# Patient Record
Sex: Male | Born: 1955 | Race: White | Hispanic: No | Smoking: Current every day smoker
Health system: Southern US, Community
[De-identification: ages and names within clinical notes are randomized; demographics above are authoritative.]

## PROBLEM LIST (undated history)

## (undated) DIAGNOSIS — N200 Calculus of kidney: Secondary | ICD-10-CM

## (undated) DIAGNOSIS — E7800 Pure hypercholesterolemia, unspecified: Secondary | ICD-10-CM

## (undated) DIAGNOSIS — I10 Essential (primary) hypertension: Secondary | ICD-10-CM

## (undated) DIAGNOSIS — Z21 Asymptomatic human immunodeficiency virus [HIV] infection status: Secondary | ICD-10-CM

## (undated) DIAGNOSIS — I219 Acute myocardial infarction, unspecified: Secondary | ICD-10-CM

## (undated) DIAGNOSIS — B20 Human immunodeficiency virus [HIV] disease: Secondary | ICD-10-CM

## (undated) HISTORY — PX: CORONARY ANGIOPLASTY WITH STENT PLACEMENT: SHX49

## (undated) HISTORY — PX: CHOLECYSTECTOMY: SHX55

---

## 2004-08-25 ENCOUNTER — Ambulatory Visit: Payer: Self-pay | Admitting: Family Medicine

## 2008-01-21 ENCOUNTER — Emergency Department: Payer: Self-pay | Admitting: Emergency Medicine

## 2015-05-14 ENCOUNTER — Emergency Department
Admission: EM | Admit: 2015-05-14 | Discharge: 2015-05-14 | Disposition: A | Discharge status: Home / Self Care | Payer: Medicare Other | Attending: Emergency Medicine | Admitting: Emergency Medicine

## 2015-05-14 ENCOUNTER — Emergency Department: Payer: Medicare Other

## 2015-05-14 ENCOUNTER — Encounter: Payer: Self-pay | Admitting: *Deleted

## 2015-05-14 DIAGNOSIS — K5900 Constipation, unspecified: Secondary | ICD-10-CM | POA: Diagnosis not present

## 2015-05-14 DIAGNOSIS — R109 Unspecified abdominal pain: Secondary | ICD-10-CM | POA: Diagnosis present

## 2015-05-14 DIAGNOSIS — Z7982 Long term (current) use of aspirin: Secondary | ICD-10-CM | POA: Diagnosis not present

## 2015-05-14 DIAGNOSIS — Z79899 Other long term (current) drug therapy: Secondary | ICD-10-CM | POA: Diagnosis not present

## 2015-05-14 DIAGNOSIS — I10 Essential (primary) hypertension: Secondary | ICD-10-CM | POA: Diagnosis not present

## 2015-05-14 DIAGNOSIS — F17210 Nicotine dependence, cigarettes, uncomplicated: Secondary | ICD-10-CM | POA: Diagnosis not present

## 2015-05-14 DIAGNOSIS — I252 Old myocardial infarction: Secondary | ICD-10-CM | POA: Diagnosis not present

## 2015-05-14 HISTORY — DX: Acute myocardial infarction, unspecified: I21.9

## 2015-05-14 HISTORY — DX: Human immunodeficiency virus (HIV) disease: B20

## 2015-05-14 HISTORY — DX: Essential (primary) hypertension: I10

## 2015-05-14 HISTORY — DX: Calculus of kidney: N20.0

## 2015-05-14 HISTORY — DX: Pure hypercholesterolemia, unspecified: E78.00

## 2015-05-14 HISTORY — DX: Asymptomatic human immunodeficiency virus (hiv) infection status: Z21

## 2015-05-14 LAB — COMPREHENSIVE METABOLIC PANEL
ALBUMIN: 4.7 g/dL (ref 3.5–5.0)
ALT: 27 U/L (ref 17–63)
AST: 23 U/L (ref 15–41)
Alkaline Phosphatase: 85 U/L (ref 38–126)
Anion gap: 8 (ref 5–15)
BUN: 16 mg/dL (ref 6–20)
CHLORIDE: 103 mmol/L (ref 101–111)
CO2: 25 mmol/L (ref 22–32)
Calcium: 9.4 mg/dL (ref 8.9–10.3)
Creatinine, Ser: 0.82 mg/dL (ref 0.61–1.24)
GFR calc Af Amer: >60 mL/min (ref >60)
GFR calc non Af Amer: >60 mL/min (ref >60)
GLUCOSE: 85 mg/dL (ref 65–99)
POTASSIUM: 3.8 mmol/L (ref 3.5–5.1)
Sodium: 136 mmol/L (ref 135–145)
Total Bilirubin: 0.8 mg/dL (ref 0.3–1.2)
Total Protein: 7.9 g/dL (ref 6.5–8.1)

## 2015-05-14 LAB — CBC
HCT: 47.3 % (ref 40.0–52.0)
Hemoglobin: 16.3 g/dL (ref 13.0–18.0)
MCH: 33.2 pg (ref 26.0–34.0)
MCHC: 34.5 g/dL (ref 32.0–36.0)
MCV: 96.2 fL (ref 80.0–100.0)
Platelets: 239 10*3/uL (ref 150–440)
RBC: 4.91 MIL/uL (ref 4.40–5.90)
RDW: 13.7 % (ref 11.5–14.5)
WBC: 7.5 10*3/uL (ref 3.8–10.6)

## 2015-05-14 LAB — URINALYSIS COMPLETE WITH MICROSCOPIC (ARMC ONLY)
BACTERIA UA: NONE SEEN
Bilirubin Urine: NEGATIVE
GLUCOSE, UA: NEGATIVE mg/dL
HGB URINE DIPSTICK: NEGATIVE
Ketones, ur: NEGATIVE mg/dL
LEUKOCYTES UA: NEGATIVE
Nitrite: NEGATIVE
Protein, ur: NEGATIVE mg/dL
RBC / HPF: NONE SEEN RBC/hpf (ref 0–5)
SQUAMOUS EPITHELIAL / LPF: NONE SEEN
Specific Gravity, Urine: 1.013 (ref 1.005–1.030)
pH: 5 (ref 5.0–8.0)

## 2015-05-14 LAB — LIPASE, BLOOD: LIPASE: 24 U/L (ref 11–51)

## 2015-05-14 MED ORDER — POLYETHYLENE GLYCOL 3350 17 G PO PACK: 17.0000 g | PACK | Freq: Every day | ORAL | Status: AC

## 2015-05-14 NOTE — ED Notes (Signed)
Labs reviewed and WNL. Awaiting room for MD eval.

## 2015-05-14 NOTE — ED Notes (Signed)
Pt c/o RLQ abdominal pain starting x 3 days ago. Pt states worse pain after eating. Pt denies n/v/d at this time. Pt has had cholecystectomy. Pt c/o difficulty having BM's. Pt denies urinary sxs.

## 2015-05-14 NOTE — Discharge Instructions (Signed)
Constipation, Adult °Constipation is when a person has fewer than three bowel movements a week, has difficulty having a bowel movement, or has stools that are dry, hard, or larger than normal. As people grow older, constipation is more common. A low-fiber diet, not taking in enough fluids, and taking certain medicines may make constipation worse.  °CAUSES  °· Certain medicines, such as antidepressants, pain medicine, iron supplements, antacids, and water pills.   °· Certain diseases, such as diabetes, irritable bowel syndrome (IBS), thyroid disease, or depression.   °· Not drinking enough water.   °· Not eating enough fiber-rich foods.   °· Stress or travel.   °· Lack of physical activity or exercise.   °· Ignoring the urge to have a bowel movement.   °· Using laxatives too much.   °SIGNS AND SYMPTOMS  °· Having fewer than three bowel movements a week.   °· Straining to have a bowel movement.   °· Having stools that are hard, dry, or larger than normal.   °· Feeling full or bloated.   °· Pain in the lower abdomen.   °· Not feeling relief after having a bowel movement.   °DIAGNOSIS  °Your health care provider will take a medical history and perform a physical exam. Further testing may be done for severe constipation. Some tests may include: °· A barium enema X-ray to examine your rectum, colon, and, sometimes, your small intestine.   °· A sigmoidoscopy to examine your lower colon.   °· A colonoscopy to examine your entire colon. °TREATMENT  °Treatment will depend on the severity of your constipation and what is causing it. Some dietary treatments include drinking more fluids and eating more fiber-rich foods. Lifestyle treatments may include regular exercise. If these diet and lifestyle recommendations do not help, your health care provider may recommend taking over-the-counter laxative medicines to help you have bowel movements. Prescription medicines may be prescribed if over-the-counter medicines do not work.    °HOME CARE INSTRUCTIONS  °· Eat foods that have a lot of fiber, such as fruits, vegetables, whole grains, and beans. °· Limit foods high in fat and processed sugars, such as french fries, hamburgers, cookies, candies, and soda.   °· A fiber supplement may be added to your diet if you cannot get enough fiber from foods.   °· Drink enough fluids to keep your urine clear or pale yellow.   °· Exercise regularly or as directed by your health care provider.   °· Go to the restroom when you have the urge to go. Do not hold it.   °· Only take over-the-counter or prescription medicines as directed by your health care provider. Do not take other medicines for constipation without talking to your health care provider first.   °SEEK IMMEDIATE MEDICAL CARE IF:  °· You have bright red blood in your stool.   °· Your constipation lasts for more than 4 days or gets worse.   °· You have abdominal or rectal pain.   °· You have thin, pencil-like stools.   °· You have unexplained weight loss. °MAKE SURE YOU:  °· Understand these instructions. °· Will watch your condition. °· Will get help right away if you are not doing well or get worse. °  °This information is not intended to replace advice given to you by your health care provider. Make sure you discuss any questions you have with your health care provider. °  °Document Released: 10/21/2003 Document Revised: 02/12/2014 Document Reviewed: 11/03/2012 °Elsevier Interactive Patient Education ©2016 [employer] ° °High-Fiber Diet °Fiber, also called dietary fiber, is a type of carbohydrate found in fruits, vegetables, whole grains, and   beans. A high-fiber diet can have many health benefits. Your health care provider may recommend a high-fiber diet to help: °· Prevent constipation. Fiber can make your bowel movements more regular. °· Lower your cholesterol. °· Relieve hemorrhoids, uncomplicated diverticulosis, or irritable bowel syndrome. °· Prevent overeating as part of a weight-loss  plan. °· Prevent heart disease, type 2 diabetes, and certain cancers. °WHAT IS MY PLAN? °The recommended daily intake of fiber includes: °· 38 grams for men under age 50. °· 30 grams for men over age 50. °· 25 grams for women under age 50. °· 21 grams for women over age 50. °You can get the recommended daily intake of dietary fiber by eating a variety of fruits, vegetables, grains, and beans. Your health care provider may also recommend a fiber supplement if it is not possible to get enough fiber through your diet. °WHAT DO I NEED TO KNOW ABOUT A HIGH-FIBER DIET? °· Fiber supplements have not been widely studied for their effectiveness, so it is better to get fiber through food sources. °· Always check the fiber content on the nutrition facts label of any prepackaged food. Look for foods that contain at least 5 grams of fiber per serving. °· Ask your dietitian if you have questions about specific foods that are related to your condition, especially if those foods are not listed in the following section. °· Increase your daily fiber consumption gradually. Increasing your intake of dietary fiber too quickly may cause bloating, cramping, or gas. °· Drink plenty of water. Water helps you to digest fiber. °WHAT FOODS CAN I EAT? °Grains °Whole-grain breads. Multigrain cereal. Oats and oatmeal. Brown rice. Barley. Bulgur wheat. Millet. Bran muffins. Popcorn. Rye wafer crackers. °Vegetables °Sweet potatoes. Spinach. Kale. Artichokes. Cabbage. Broccoli. Green peas. Carrots. Squash. °Fruits °Berries. Pears. Apples. Oranges. Avocados. Prunes and raisins. Dried figs. °Meats and Other Protein Sources °[place], kidney, pinto, and soy beans. Split peas. Lentils. Nuts and seeds. °Dairy °Fiber-fortified yogurt. °Beverages °Fiber-fortified soy milk. Fiber-fortified orange juice. °Other °Fiber bars. °The items listed above may not be a complete list of recommended foods or beverages. Contact your dietitian for more options. °WHAT FOODS  ARE NOT RECOMMENDED? °Grains °White bread. Pasta made with refined flour. White rice. °Vegetables °Fried potatoes. Canned vegetables. Well-cooked vegetables.  °Fruits °Fruit juice. Cooked, strained fruit. °Meats and Other Protein Sources °Fatty cuts of meat. Fried poultry or fried fish. °Dairy °Milk. Yogurt. Cream cheese. Sour cream. °Beverages °Soft drinks. °Other °Cakes and pastries. Butter and oils. °The items listed above may not be a complete list of foods and beverages to avoid. Contact your dietitian for more information. °WHAT ARE SOME TIPS FOR INCLUDING HIGH-FIBER FOODS IN MY DIET? °· Eat a wide variety of high-fiber foods. °· Make sure that half of all grains consumed each day are whole grains. °· Replace breads and cereals made from refined flour or white flour with whole-grain breads and cereals. °· Replace white rice with brown rice, bulgur wheat, or millet. °· Start the day with a breakfast that is high in fiber, such as a cereal that contains at least 5 grams of fiber per serving. °· Use beans in place of meat in soups, salads, or pasta. °· Eat high-fiber snacks, such as berries, raw vegetables, nuts, or popcorn. °  °This information is not intended to replace advice given to you by your health care provider. Make sure you discuss any questions you have with your health care provider. °  °Document Released: 01/22/2005 Document Revised: 02/12/2014 Document   Reviewed: 07/07/2013 °Elsevier Interactive Patient Education ©2016 Elsevier Inc. ° °

## 2015-05-14 NOTE — ED Provider Notes (Signed)
[facility] Emergency Department Provider Note     Time seen: ----------------------------------------- 9:22 PM on 05/14/2015 -----------------------------------------    I have reviewed the triage vital signs and the nursing notes.   HISTORY  Chief Complaint Abdominal Pain    HPI Tyler Campbell is a 60 y.o. male who presents to ER for right-sided abdominal pain that started 3 days ago. Patient states she's not had a bowel movement in 4 days, pain is worst after eating. He denies fevers, chills, chest pain, shortness of breath, nausea vomiting or diarrhea. Patient denies any urinary symptoms.   Past Medical History  Diagnosis Date  . Kidney stones   . HIV (human immunodeficiency virus infection) ([place])   . Myocardial infarct ([place])   . Hypertension   . Hypercholesteremia     There are no active problems to display for this patient.   Past Surgical History  Procedure Laterality Date  . Cholecystectomy    . Coronary angioplasty with stent placement Right     Allergies Review of patient's allergies indicates no known allergies.  Social History Social History  Substance Use Topics  . Smoking status: Current Every Day Smoker    Types: Cigarettes  . Smokeless tobacco: Never Used  . Alcohol Use: Yes     Comment: rarely    Review of Systems Constitutional: Negative for fever. Eyes: Negative for visual changes. ENT: Negative for sore throat. Cardiovascular: Negative for chest pain. Respiratory: Negative for shortness of breath. Gastrointestinal: Positive for abdominal pain and constipation Genitourinary: Negative for dysuria. Musculoskeletal: Negative for back pain. Skin: Negative for rash. Neurological: Negative for headaches, focal weakness or numbness.  10-point ROS otherwise negative.  ____________________________________________   PHYSICAL EXAM:  VITAL SIGNS: ED Triage Vitals  Enc Vitals Group     BP 05/14/15 1913  143/73 mmHg     Pulse Rate 05/14/15 1913 97     Resp 05/14/15 1913 16     Temp 05/14/15 1913 97.8 F (36.6 C)     Temp Source 05/14/15 1913 Oral     SpO2 05/14/15 1913 94 %     Weight 05/14/15 1913 169 lb (76.658 kg)     Height 05/14/15 1913 5\' 8"  (1.727 m)     Head Cir --      Peak Flow --      Pain Score 05/14/15 1916 4     Pain Loc --      Pain Edu? --      Excl. in [place]? --     Constitutional: Alert and oriented. Well appearing and in no distress. Eyes: Conjunctivae are normal. PERRL. Normal extraocular movements. ENT   Head: Normocephalic and atraumatic.   Nose: No congestion/rhinnorhea.   Mouth/Throat: Mucous membranes are moist.   Neck: No stridor. Cardiovascular: Normal rate, regular rhythm. Normal and symmetric distal pulses are present in all extremities. No murmurs, rubs, or gallops. Respiratory: Normal respiratory effort without tachypnea nor retractions. Breath sounds are clear and equal bilaterally. No wheezes/rales/rhonchi. Gastrointestinal: Soft and nontender. No distention. No abdominal bruits. Normal bowel sounds Musculoskeletal: Nontender with normal range of motion in all extremities. No joint effusions.  No lower extremity tenderness nor edema. Neurologic:  Normal speech and language. No gross focal neurologic deficits are appreciated. Speech is normal. No gait instability. Skin:  Skin is warm, dry and intact. No rash noted. Psychiatric: Mood and affect are normal. Speech and behavior are normal. Patient exhibits appropriate insight and judgment. _ ____________________________________________  ED COURSE:  Pertinent  labs & imaging results that were available during my care of the patient were reviewed by me and considered in my medical decision making (see chart for details). Patient is in no acute distress, likely constipation. We will check basic labs and x-rays. ____________________________________________    LABS (pertinent  positives/negatives)  Labs Reviewed  URINALYSIS COMPLETEWITH MICROSCOPIC ([place]) - Abnormal; Notable for the following:    Color, Urine YELLOW (*)    APPearance CLEAR (*)    All other components within normal limits  LIPASE, BLOOD  COMPREHENSIVE METABOLIC PANEL  CBC    RADIOLOGY Images were viewed by me  KUB reveals constipation  ____________________________________________  FINAL ASSESSMENT AND PLAN  Constipation  Plan: Patient with labs and imaging as dictated above. Patient's workup here is been unremarkable, he'll be discharged with MiraLAX and encouraged to have close follow-up with his doctor for improving.   Earleen Newport, MD   Earleen Newport, MD 05/14/15 2123

## 2015-07-05 ENCOUNTER — Emergency Department
Admission: EM | Admit: 2015-07-05 | Discharge: 2015-07-05 | Disposition: A | Discharge status: Home / Self Care | Payer: Medicare Other | Attending: Emergency Medicine | Admitting: Emergency Medicine

## 2015-07-05 ENCOUNTER — Emergency Department: Payer: Medicare Other

## 2015-07-05 ENCOUNTER — Encounter: Payer: Self-pay | Admitting: Emergency Medicine

## 2015-07-05 DIAGNOSIS — Z955 Presence of coronary angioplasty implant and graft: Secondary | ICD-10-CM | POA: Diagnosis not present

## 2015-07-05 DIAGNOSIS — M7581 Other shoulder lesions, right shoulder: Secondary | ICD-10-CM

## 2015-07-05 DIAGNOSIS — M25511 Pain in right shoulder: Secondary | ICD-10-CM | POA: Diagnosis present

## 2015-07-05 DIAGNOSIS — F17210 Nicotine dependence, cigarettes, uncomplicated: Secondary | ICD-10-CM | POA: Insufficient documentation

## 2015-07-05 DIAGNOSIS — I252 Old myocardial infarction: Secondary | ICD-10-CM | POA: Diagnosis not present

## 2015-07-05 DIAGNOSIS — Z79899 Other long term (current) drug therapy: Secondary | ICD-10-CM | POA: Insufficient documentation

## 2015-07-05 DIAGNOSIS — I10 Essential (primary) hypertension: Secondary | ICD-10-CM | POA: Insufficient documentation

## 2015-07-05 DIAGNOSIS — M75101 Unspecified rotator cuff tear or rupture of right shoulder, not specified as traumatic: Secondary | ICD-10-CM | POA: Insufficient documentation

## 2015-07-05 DIAGNOSIS — Z7982 Long term (current) use of aspirin: Secondary | ICD-10-CM | POA: Diagnosis not present

## 2015-07-05 DIAGNOSIS — B20 Human immunodeficiency virus [HIV] disease: Secondary | ICD-10-CM | POA: Diagnosis not present

## 2015-07-05 DIAGNOSIS — M7531 Calcific tendinitis of right shoulder: Secondary | ICD-10-CM | POA: Insufficient documentation

## 2015-07-05 MED ORDER — KETOROLAC TROMETHAMINE 30 MG/ML IJ SOLN
INTRAMUSCULAR | Status: AC
  Filled 2015-07-05: qty 1

## 2015-07-05 MED ORDER — CYCLOBENZAPRINE HCL 5 MG PO TABS: 5.0000 mg | ORAL_TABLET | Freq: Three times a day (TID) | ORAL | Status: AC | PRN

## 2015-07-05 MED ORDER — KETOROLAC TROMETHAMINE 10 MG PO TABS: 10.0000 mg | ORAL_TABLET | Freq: Three times a day (TID) | ORAL | Status: AC

## 2015-07-05 MED ORDER — KETOROLAC TROMETHAMINE 60 MG/2ML IM SOLN
30.0000 mg | Freq: Once | INTRAMUSCULAR | Status: AC
  Administered 2015-07-05: 30 mg via INTRAMUSCULAR

## 2015-07-05 NOTE — ED Notes (Signed)
See triage note  States he was putting up siding and used his right arm a lot  haivng pain today with movement  Denies any fall no deformity noted

## 2015-07-05 NOTE — Discharge Instructions (Signed)
Calcific Tendinitis Calcific tendinitis occurs when crystals of calcium are deposited in a tendon. Tendons are bands of strong, fibrous tissue that attach muscles to bones. Tendons are an important part of joints. They make the joint move and they absorb some of the stress that a joint receives during use. When calcium is deposited in the tendon, the tendon becomes stiff, painful, and it can become swollen. Calcific tendinitis occurs frequently in the shoulder joint, in a structure called the rotator cuff. CAUSES  The cause of calcific tendinitis is unclear. It may be associated with:  Overuse of the tendon, such as from repetitive motion.  Excess stress on the tendon.  Aging.  Repetitive, mild injuries. SYMPTOMS   Pain may or may not be present. If it is present, it may occur when moving the joint.  Tenderness when pressure is applied to the tendon.  A snapping or popping sound when the joint moves.  Decreased motion of the joint.  Difficulty sleeping due to pain in the joint. DIAGNOSIS  Your health care provider will perform a physical exam. Imaging tests may also be used to make the diagnosis. These may include X-rays, an MRI, or a CT scan. TREATMENT  Generally, calcific tendinitis resolves on its own. Treatment for pain of calcific tendinitis may include:  Taking over-the-counter medicines, such as anti-inflammatory drugs.  Applying ice packs to the joint.  Following a specific exercise program to keep the joint working properly.  Attending physical therapy sessions.  Avoiding activities that cause pain. Treatment for more severe calcific tendinitis may require:  Injecting cortisone steroids or pain relieving medicines into or around the joint.  Manipulating the joint after you are given medicine to numb the area (local anesthetic).  Inflating the joint with sterile fluid to increase the flexibility of the tendons.  Shock wave therapy, which involves focusing sound  waves on the joint. If other treatments do not work, surgery may be done to clean out the calcium deposits and repair the tendons where needed. Most people do not need surgery. HOME CARE INSTRUCTIONS   Only take over-the-counter or prescription medicines for pain, fever, or discomfort as directed by your health care provider.  Follow your health care provider's recommendations for activity and exercise. SEEK MEDICAL CARE IF:  You notice an increase in pain or numbness.  You develop new weakness.  You notice increased joint stiffness or a sensation of looseness in the joint.  You notice increasing redness, swelling, or warmth around the joint area. SEEK IMMEDIATE MEDICAL CARE IF:  You have a fever or persistent symptoms for more than 2 to 3 days.  You have a fever and your symptoms suddenly get worse. MAKE SURE YOU:  Understand these instructions.  Will watch your condition.  Will get help right away if you are not doing well or get worse.   This information is not intended to replace advice given to you by your health care provider. Make sure you discuss any questions you have with your health care provider.   Document Released: 11/01/2007 Document Revised: 10/13/2014 Document Reviewed: 05/03/2011 Elsevier Interactive Patient Education 2016 [place] Cuff Tendinitis Rotator cuff tendinitis is inflammation of the tough, cord-like bands that connect muscle to bone (tendons) in your rotator cuff. Your rotator cuff is the collection of all the muscles and tendons that connect your arm to your shoulder. Your rotator cuff holds the head of your upper arm bone (humerus) in the cup (fossa) of your shoulder blade (scapula).  CAUSES Rotator cuff tendinitis is usually caused by overusing the joint involved.  SIGNS AND SYMPTOMS  Deep ache in the shoulder also felt on the outside upper arm over the shoulder muscle.  Point tenderness over the area that is injured.  Pain  comes on gradually and becomes worse with lifting the arm to the side (abduction) or turning it inward (internal rotation).  May lead to a chronic tear: When a rotator cuff tendon becomes inflamed, it runs the risk of losing its blood supply, causing some tendon fibers to die. This increases the risk that the tendon can fray and partially or completely tear. DIAGNOSIS Rotator cuff tendinitis is diagnosed by taking a medical history, performing a physical exam, and reviewing results of imaging exams. The medical history is useful to help determine the type of rotator cuff injury. The physical exam will include looking at the injured shoulder, feeling the injured area, and watching you do range-of-motion exercises. X-ray exams are typically done to rule out other causes of shoulder pain, such as fractures. MRI is the imaging exam usually used for significant shoulder injuries. Sometimes a dye study called CT arthrogram is done, but it is not as widely used as MRI. In some institutions, special ultrasound tests may also be used to aid in the diagnosis. TREATMENT  Less Severe Cases  Use of a sling to rest the shoulder for a short period of time. Prolonged use of the sling can cause stiffness, weakness, and loss of motion of the shoulder joint.  Anti-inflammatory medicines, such as ibuprofen or naproxen sodium, may be prescribed. More Severe Cases  Physical therapy.  Use of steroid injections into the shoulder joint.  Surgery. HOME CARE INSTRUCTIONS   Use a sling or splint until the pain decreases. Prolonged use of the sling can cause stiffness, weakness, and loss of motion of the shoulder joint.  Apply ice to the injured area:  Put ice in a plastic bag.  Place a towel between your skin and the bag.  Leave the ice on for 20 minutes, 2-3 times a day.  Try to avoid use other than gentle range of motion while your shoulder is painful. Use the shoulder and exercise only as directed by your  health care provider. Stop exercises or range of motion if pain or discomfort increases, unless directed otherwise by your health care provider.  Only take over-the-counter or prescription medicines for pain, discomfort, or fever as directed by your health care provider.  If you were given a shoulder sling and straps (immobilizer), do not remove it except as directed, or until you see a health care provider for a follow-up exam. If you need to remove it, move your arm as little as possible or as directed.  You may want to sleep on several pillows at night to lessen swelling and pain. SEEK IMMEDIATE MEDICAL CARE IF:   Your shoulder pain increases or new pain develops in your arm, hand, or fingers and is not relieved with medicines.  You have new, unexplained symptoms, especially increased numbness in the hands or loss of strength.  You develop any worsening of the problems that brought you in for care.  Your arm, hand, or fingers are numb or tingling.  Your arm, hand, or fingers are swollen, painful, or turn white or blue. MAKE SURE YOU:  Understand these instructions.  Will watch your condition.  Will get help right away if you are not doing well or get worse.   This information is not  intended to replace advice given to you by your health care provider. Make sure you discuss any questions you have with your health care provider.   Document Released: 04/14/2003 Document Revised: 02/12/2014 Document Reviewed: 09/03/2012 Elsevier Interactive Patient Education 2016 [place] with Dr. Rudene Christians if symptoms don't improve in 1-2 weeks with treatment. Take the prescription meds as directed. Advance activity as pain allows.

## 2015-07-05 NOTE — ED Notes (Signed)
Pt states on Friday he was putting up siding on a house and his right shoulder began hurting, now pt unable to move his right arm at all due to the pain.

## 2015-07-05 NOTE — ED Provider Notes (Signed)
[facility] Emergency Department Provider Note ____________________________________________  Time seen: 1750  I have reviewed the triage vital signs and the nursing notes.  HISTORY  Chief Complaint  Shoulder Pain  HPI Tyler Campbell is a 60 y.o. male presents to the ED for evaluation of pain and disability to his right shoulder. He describes that he was working on his home Friday but none citing, and by Saturday morning he awoke with significant decrease in range of motion to his right shoulder secondary to the pain. He is noted pain from the anterior lateral aspect of the shoulder with some referral into the upper extremity. He denies any history of previous shoulder injury, fracture, or rotator cuff tendinitis. He's been dosing ibuprofen with limited benefit. Denies any other injury, accident, fall to the shoulder.The patient is right-hand dominant and rates his pain at a 10/10 in triage.  Past Medical History  Diagnosis Date  . Kidney stones   . HIV (human immunodeficiency virus infection) ([place])   . Myocardial infarct ([place])   . Hypertension   . Hypercholesteremia     There are no active problems to display for this patient.   Past Surgical History  Procedure Laterality Date  . Cholecystectomy    . Coronary angioplasty with stent placement Right     Current Outpatient Rx  Name  Route  Sig  Dispense  Refill  . aspirin 81 MG tablet   Oral   Take 81 mg by mouth daily.         . cyclobenzaprine (FLEXERIL) 5 MG tablet   Oral   Take 1 tablet (5 mg total) by mouth 3 (three) times daily as needed for muscle spasms.   15 tablet   0   . emtricitabine-rilpivir-tenofovir AF (ODEFSEY) 200-25-25 MG TABS tablet   Oral   Take 1 tablet by mouth daily with breakfast.         . ketorolac (TORADOL) 10 MG tablet   Oral   Take 1 tablet (10 mg total) by mouth every 8 (eight) hours.   15 tablet   0   . lisinopril (PRINIVIL,ZESTRIL) 5 MG tablet   Oral    Take 5 mg by mouth daily.         . metoprolol tartrate (LOPRESSOR) 25 MG tablet   Oral   Take 25 mg by mouth 2 (two) times daily.         . polyethylene glycol (MIRALAX / GLYCOLAX) packet   Oral   Take 17 g by mouth daily.   14 each   0   . rosuvastatin (CRESTOR) 10 MG tablet   Oral   Take 10 mg by mouth daily.          Allergies Review of patient's allergies indicates no known allergies.  No family history on file.  Social History Social History  Substance Use Topics  . Smoking status: Current Every Day Smoker -- 1.00 packs/day    Types: Cigarettes  . Smokeless tobacco: Never Used  . Alcohol Use: Yes     Comment: rarely    Review of Systems  Constitutional: Negative for fever. Cardiovascular: Negative for chest pain. Respiratory: Negative for shortness of breath. Musculoskeletal: Negative for back pain. Right shoulder pain as above. Skin: Negative for rash. Neurological: Negative for headaches, focal weakness or numbness. ____________________________________________  PHYSICAL EXAM:  VITAL SIGNS: ED Triage Vitals  Enc Vitals Group     BP 07/05/15 1722 150/72 mmHg     Pulse Rate  07/05/15 1722 87     Resp 07/05/15 1722 18     Temp 07/05/15 1722 97.8 F (36.6 C)     Temp Source 07/05/15 1722 Oral     SpO2 07/05/15 1722 100 %     Weight 07/05/15 1722 170 lb (77.111 kg)     Height 07/05/15 1722 5\' 8"  (1.727 m)     Head Cir --      Peak Flow --      Pain Score 07/05/15 1722 10     Pain Loc --      Pain Edu? --      Excl. in [place]? --    Constitutional: Alert and oriented. Well appearing and in no distress. Head: Normocephalic and atraumatic. Cardiovascular: Normal rate, regular rhythm.  Respiratory: Normal respiratory effort. No wheezes/rales/rhonchi. Musculoskeletal: Right shoulder without obvious deformity, dislocation, or sulcus sign. Patient is tender to palpation over the lateral aspect of the shoulder at the before meals joint. He also some mild  biceps tendinitis on palp. The shin with severely decreased active range of motion secondary to pain. Passively he is able to extend the shoulder to about 90. Passively his abduction ranges about 60. He can't imaging a normal composite fist and his elbow and wrist exams are unremarkable. Nontender with normal range of motion in all other extremities.  Neurologic: Gross sensation to the right upper extremity. Normal gait without ataxia. Normal speech and language. No gross focal neurologic deficits are appreciated. Skin:  Skin is warm, dry and intact. No rash noted. ____________________________________________   RADIOLOGY  Right Shoulder   FINDINGS: Calcification along the rotator cuff. No fracture or subluxation. No degenerative changes at the glenohumeral joint. Negative visualized right chest.  IMPRESSION: Calcific tendinitis of the rotator cuff.  I, Jianni Shelden, Dannielle Karvonen, personally viewed and evaluated these images (plain radiographs) as part of my medical decision making, as well as reviewing the written report by the radiologist. ____________________________________________  PROCEDURES  Toradol 30 mg IM Arm sling ____________________________________________  INITIAL IMPRESSION / ASSESSMENT AND PLAN / ED COURSE  Patient with the neck to calcific tendinitis of the right shoulder and an acute rotator cuff tendinitis. He will be discharged with prescription for ketorolac and cyclobenzaprine. He'll be referred to or at the [facility] ongoing symptom management as needed. He is also provided with a sling for comfort to the right shoulder. His activities of limited by his pain and or disability. ____________________________________________  FINAL CLINICAL IMPRESSION(S) / ED DIAGNOSES  Final diagnoses:  Calcific tendinitis of shoulder, right  Rotator cuff tendinitis, right     Melvenia Needles, PA-C 07/06/15 0127  Lisa Roca, MD 07/08/15 [phone]

## 2017-08-07 IMAGING — CR DG ABDOMEN 1V
2 series · 2 of 2 positions shown · non-contrast
Comparison: None.

CLINICAL DATA: Right-sided abdominal swelling.  Constipation.

EXAM:
ABDOMEN - 1 VIEW

[abdomen kub (1 of 2)]
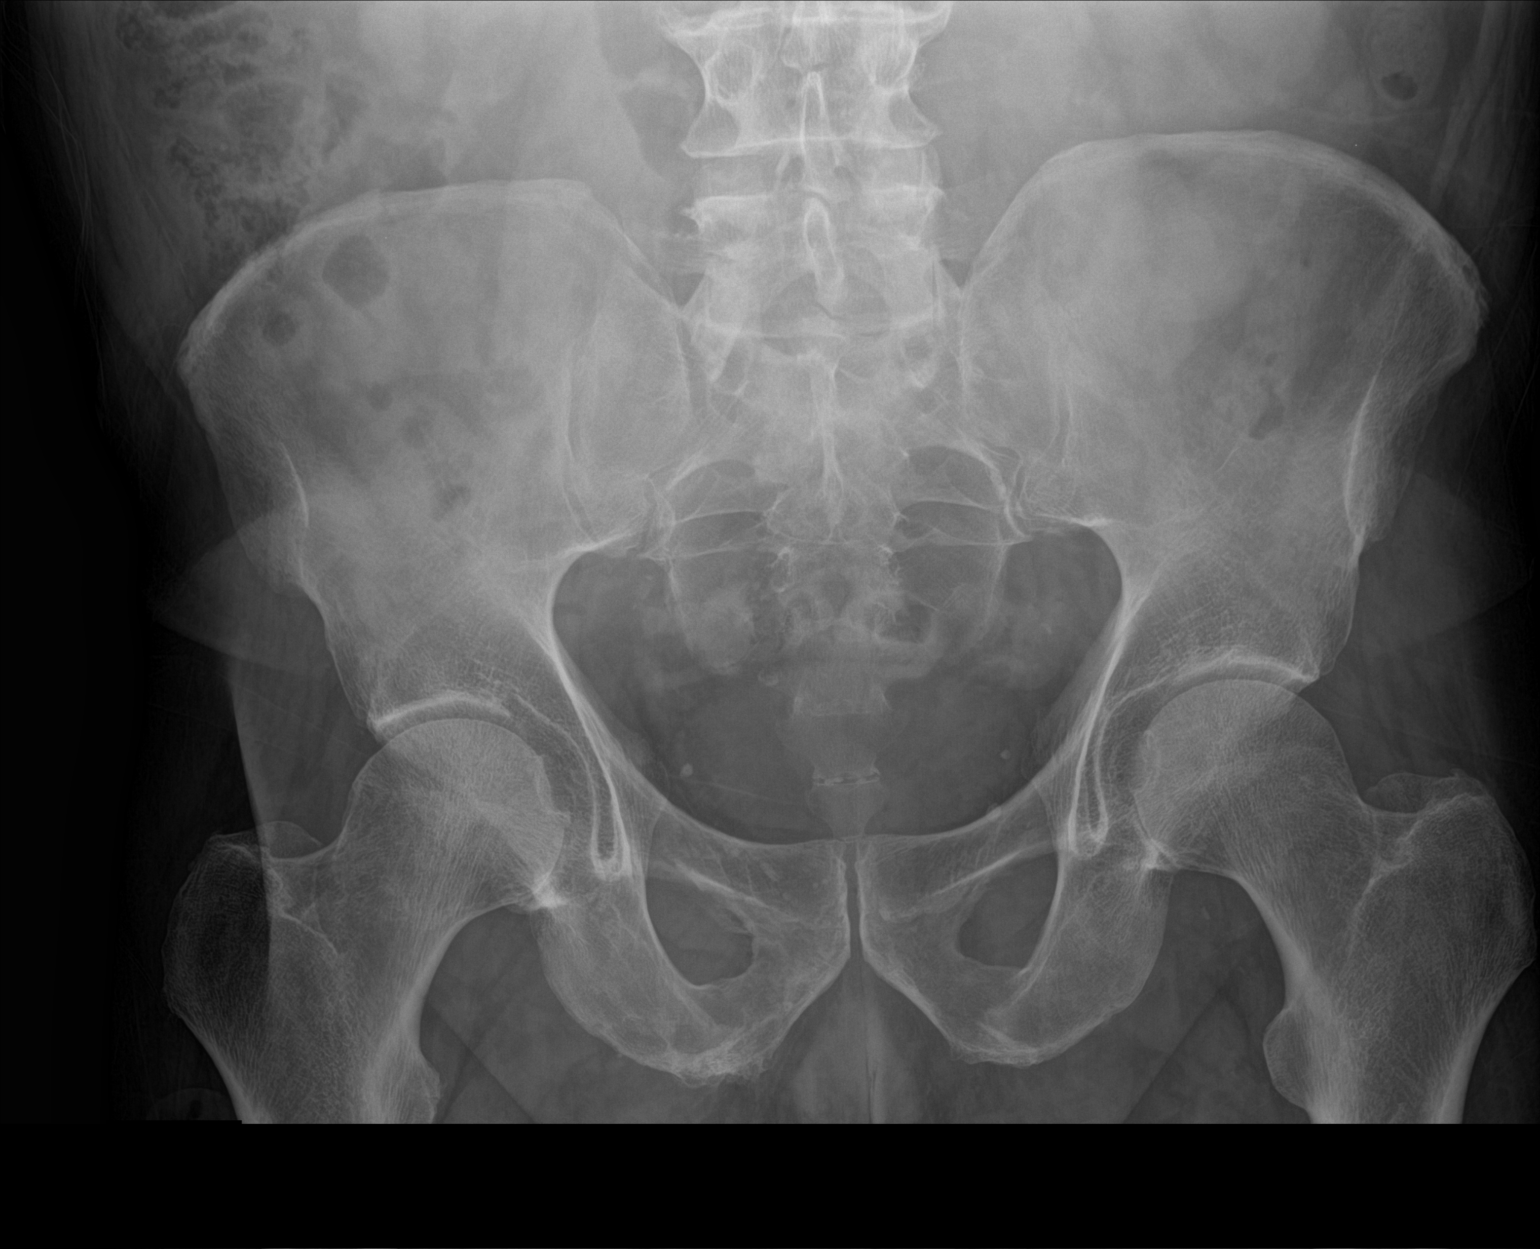

[abdomen kub (2 of 2)]
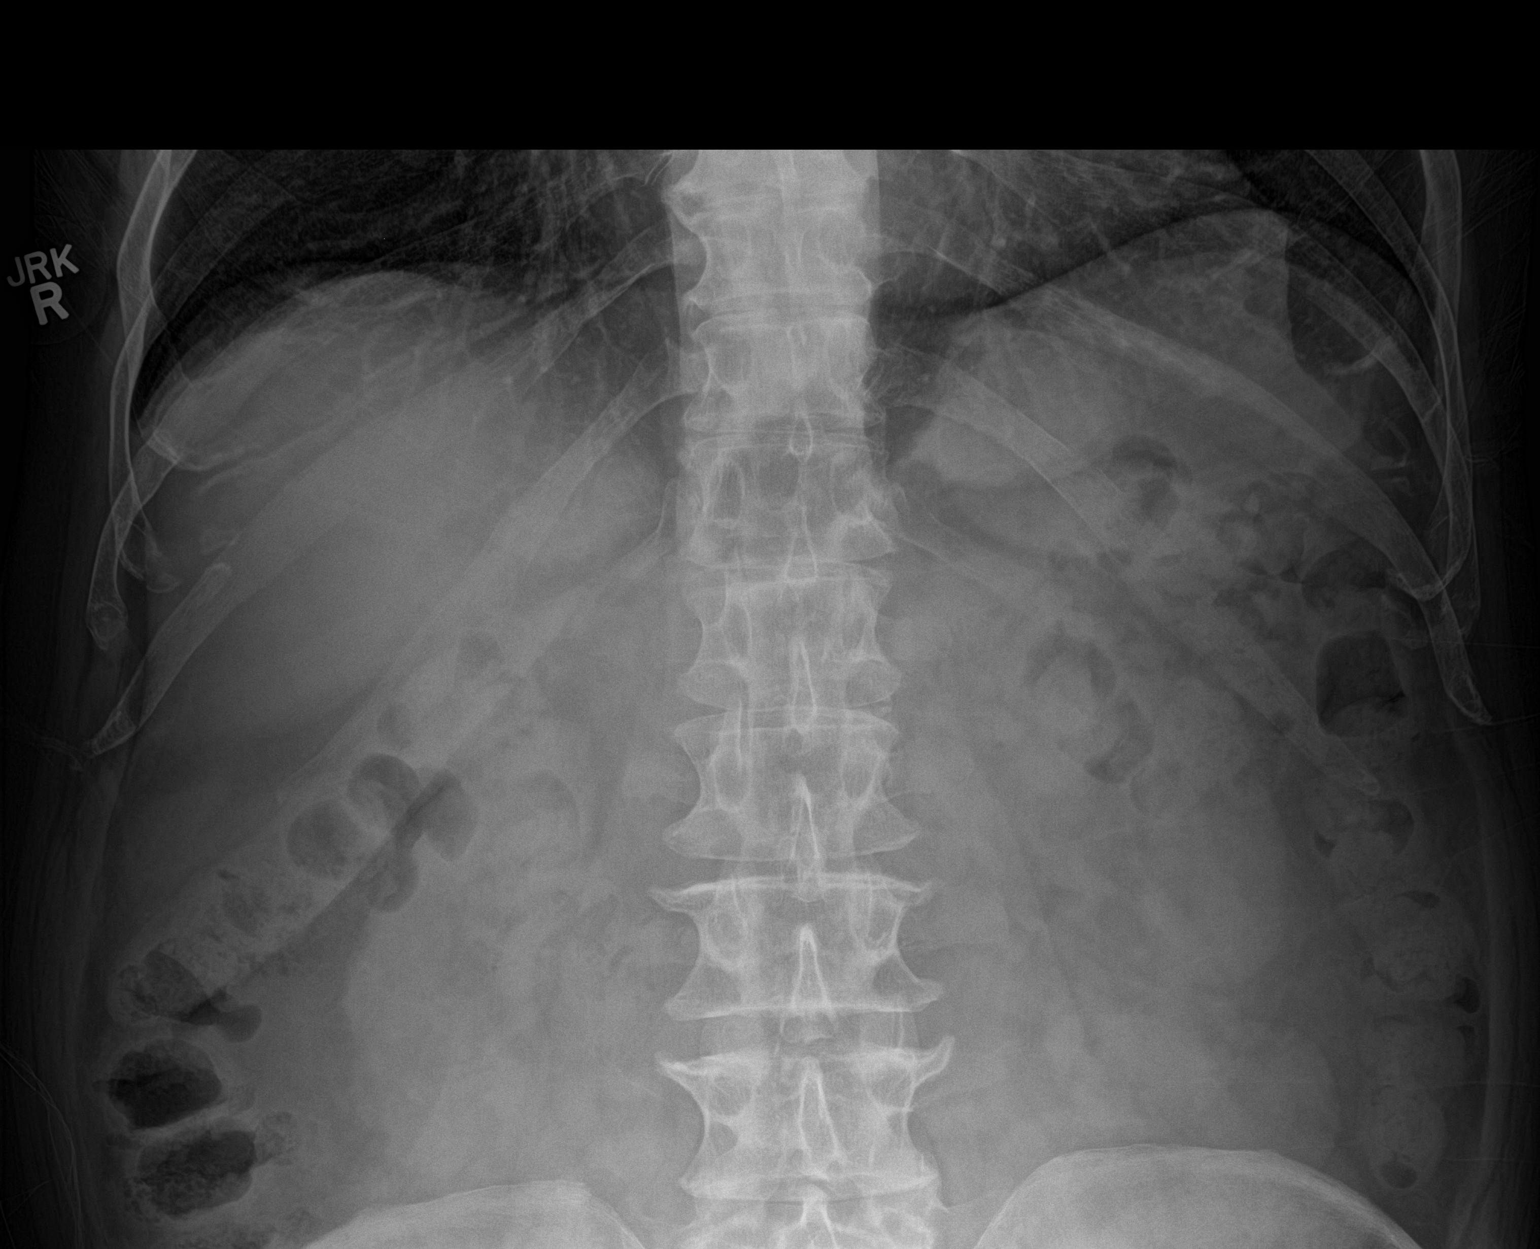

[2 of 2 positions shown; findings below may reference images not displayed]

FINDINGS: The bowel gas pattern is normal. No radio-opaque calculi or other
significant radiographic abnormality are seen.
IMPRESSION: Negative.

## 2017-09-28 IMAGING — CR DG SHOULDER 2+V*R*
3 series · 3 of 3 positions shown · non-contrast
Comparison: None.

CLINICAL DATA: Right shoulder pain beginning [REDACTED] with limited
range of motion.

EXAM:
RIGHT SHOULDER - 2+ VIEW

[shoulder grashey (1 of 2)]
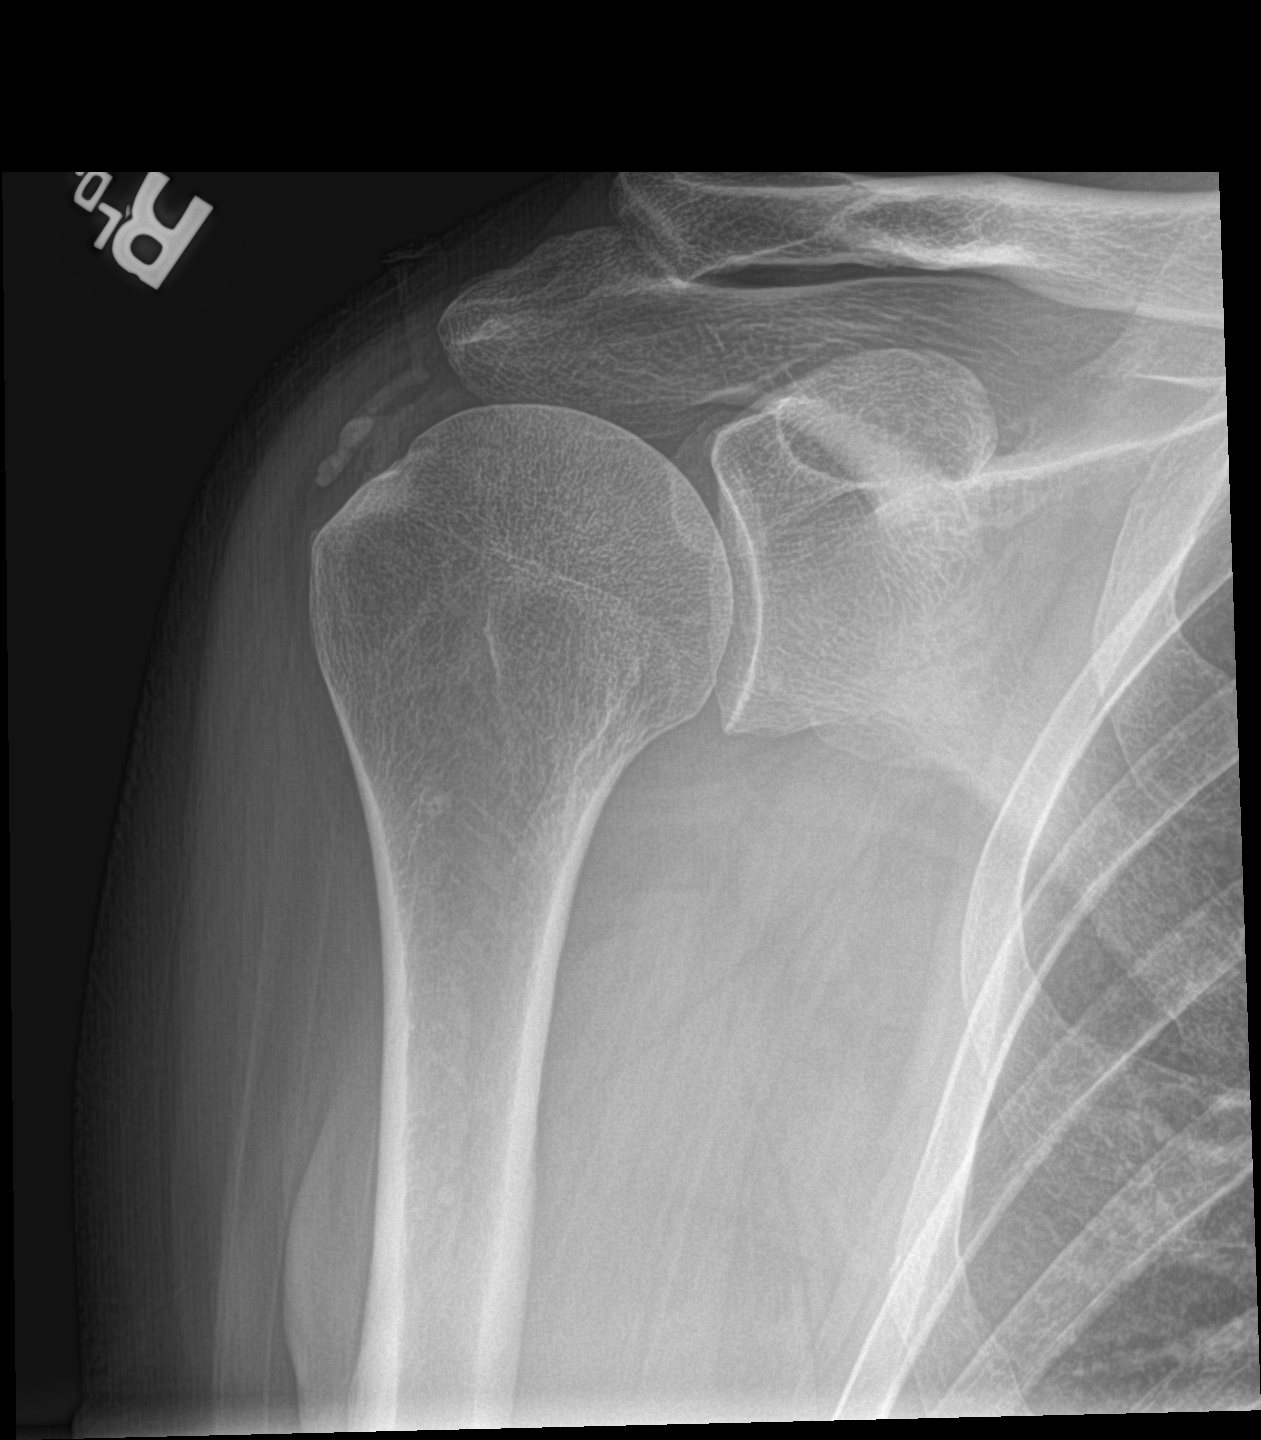

[shoulder y view]
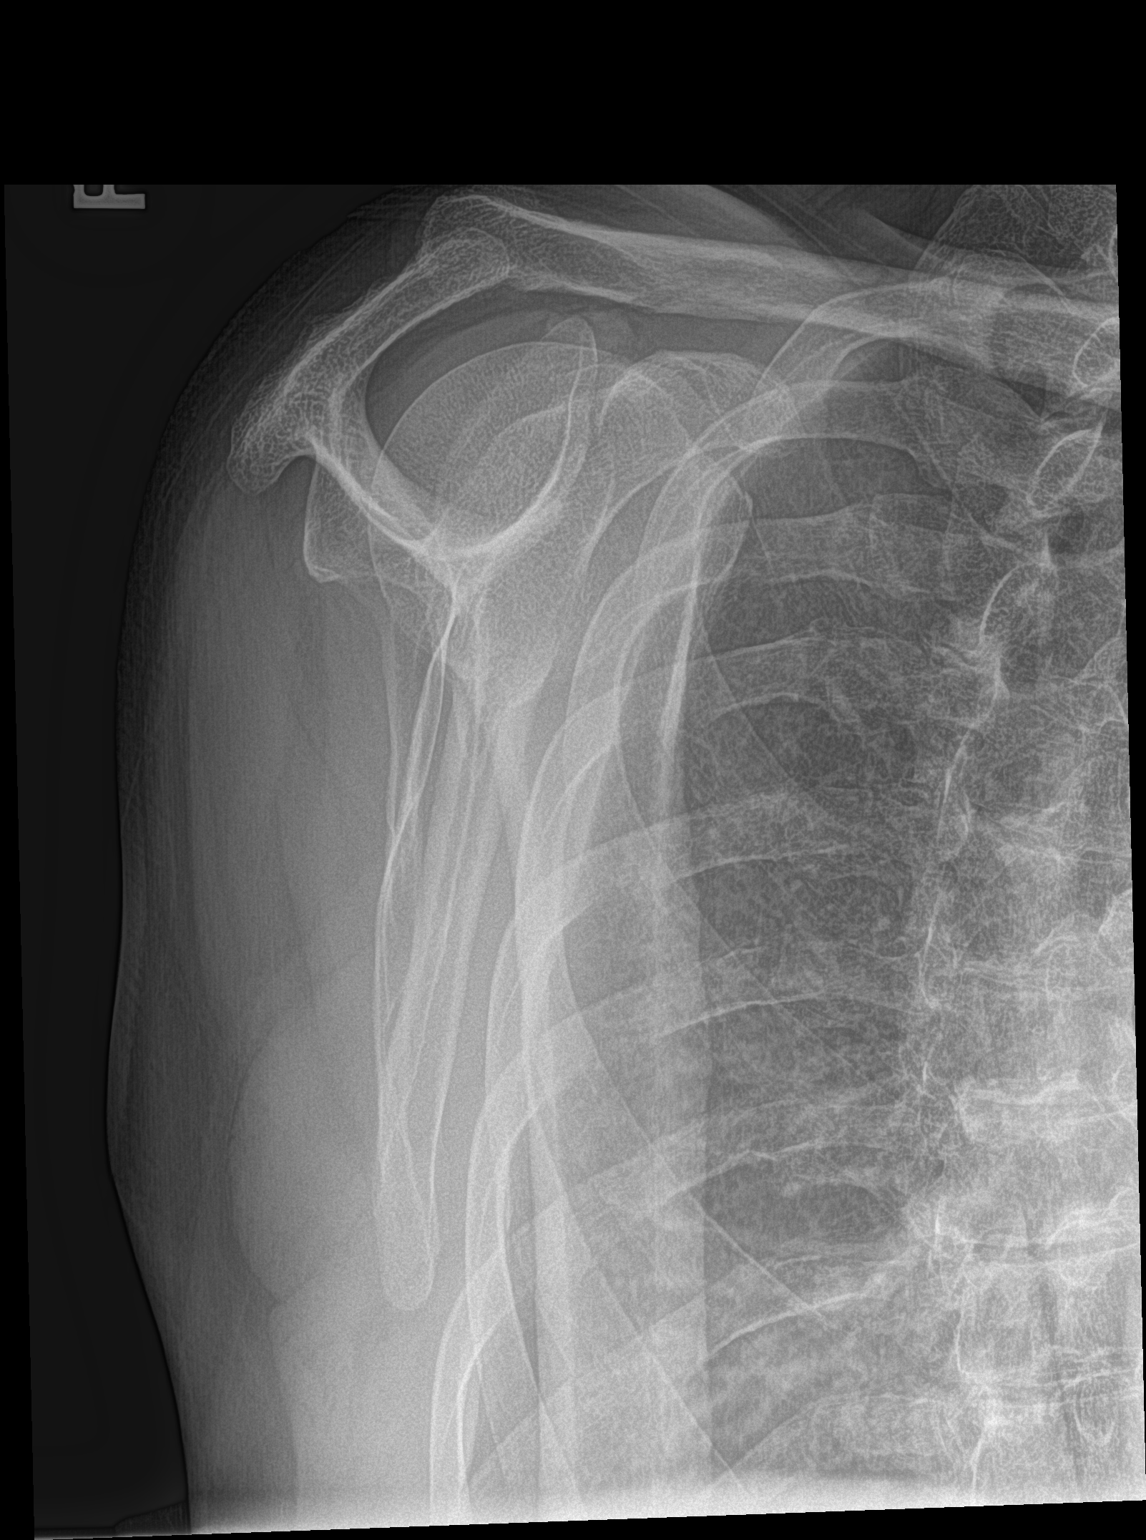

[shoulder grashey (2 of 2)]
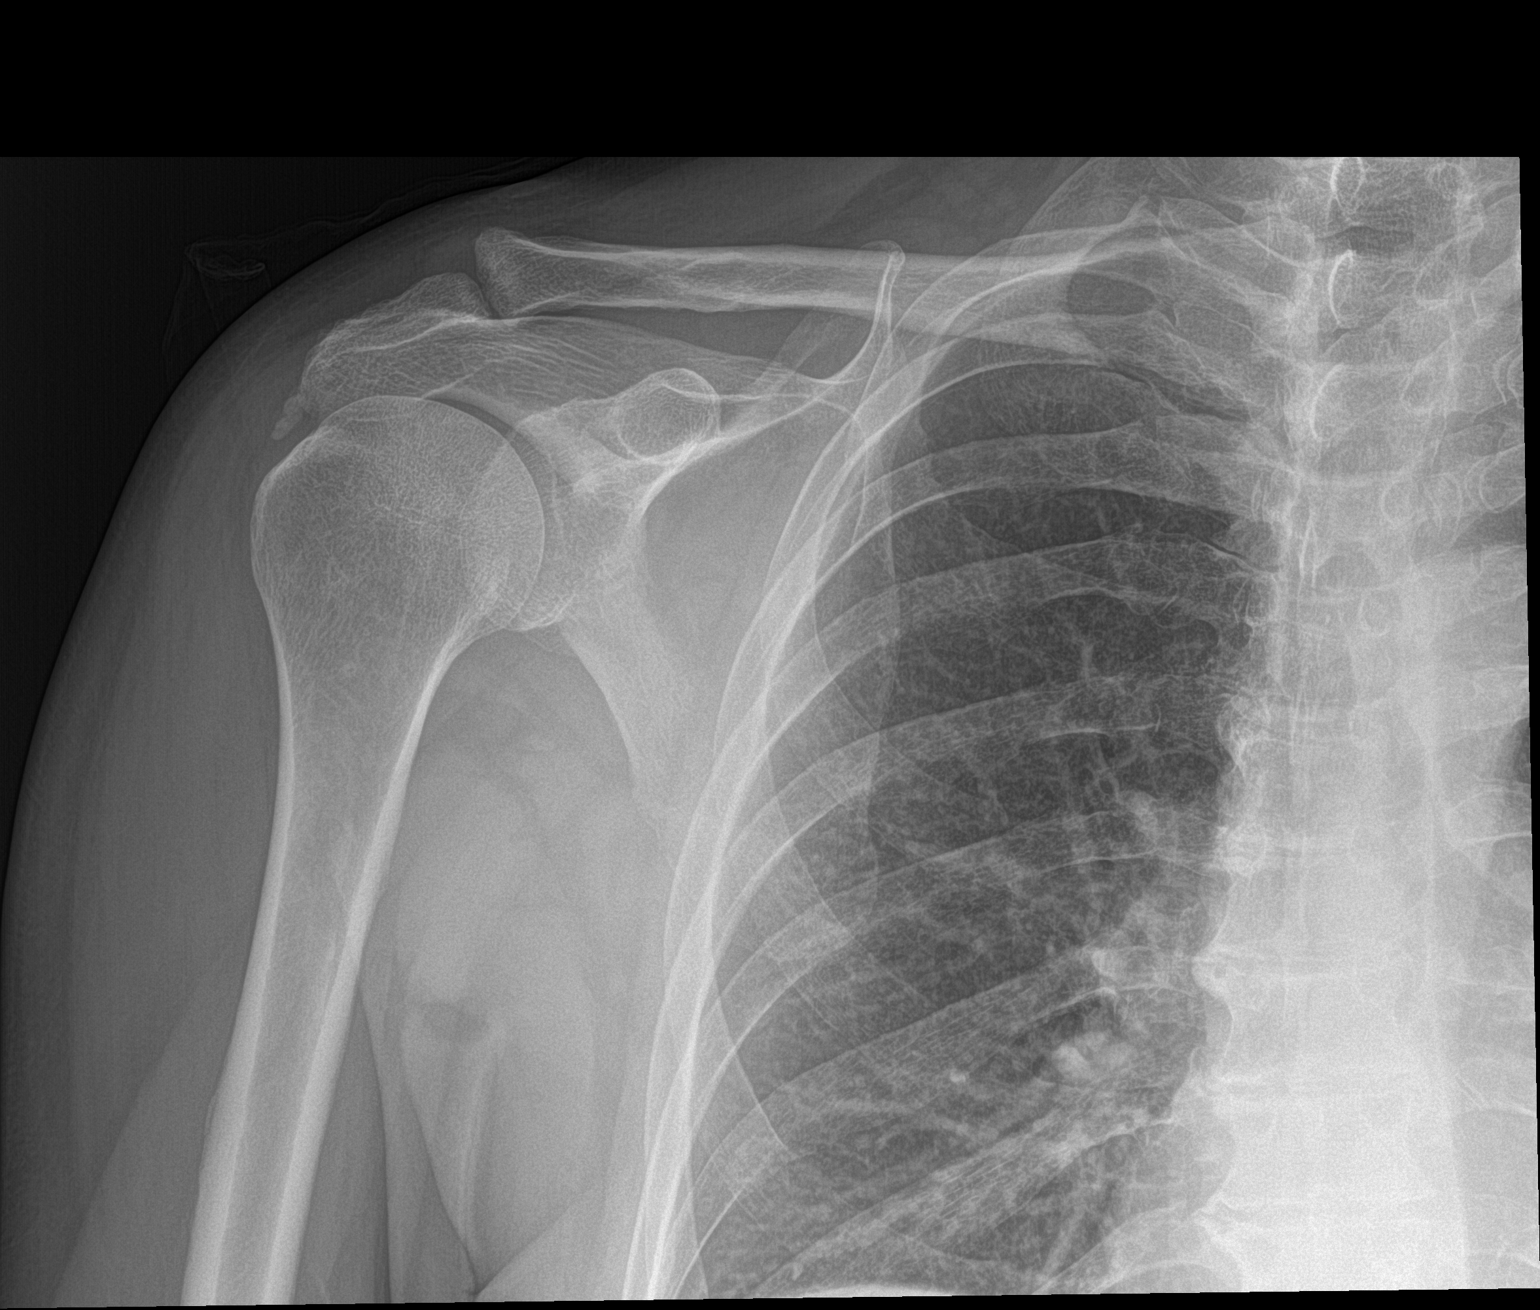

[3 of 3 positions shown; findings below may reference images not displayed]

FINDINGS: Calcification along the rotator cuff. No fracture or subluxation. No
degenerative changes at the glenohumeral joint. Negative visualized
right chest.
IMPRESSION: Calcific tendinitis of the rotator cuff.

## 2021-07-12 DIAGNOSIS — L255 Unspecified contact dermatitis due to plants, except food: Secondary | ICD-10-CM | POA: Diagnosis not present

## 2021-07-17 DIAGNOSIS — Z79899 Other long term (current) drug therapy: Secondary | ICD-10-CM | POA: Diagnosis not present

## 2021-07-17 DIAGNOSIS — I252 Old myocardial infarction: Secondary | ICD-10-CM | POA: Diagnosis not present

## 2021-07-17 DIAGNOSIS — I10 Essential (primary) hypertension: Secondary | ICD-10-CM | POA: Diagnosis not present

## 2021-07-17 DIAGNOSIS — Z7982 Long term (current) use of aspirin: Secondary | ICD-10-CM | POA: Diagnosis not present

## 2021-07-17 DIAGNOSIS — Z9049 Acquired absence of other specified parts of digestive tract: Secondary | ICD-10-CM | POA: Diagnosis not present

## 2021-07-17 DIAGNOSIS — J432 Centrilobular emphysema: Secondary | ICD-10-CM | POA: Diagnosis not present

## 2021-07-17 DIAGNOSIS — Z85818 Personal history of malignant neoplasm of other sites of lip, oral cavity, and pharynx: Secondary | ICD-10-CM | POA: Diagnosis not present

## 2021-07-17 DIAGNOSIS — K769 Liver disease, unspecified: Secondary | ICD-10-CM | POA: Diagnosis not present

## 2021-07-17 DIAGNOSIS — E785 Hyperlipidemia, unspecified: Secondary | ICD-10-CM | POA: Diagnosis not present

## 2021-07-17 DIAGNOSIS — Z955 Presence of coronary angioplasty implant and graft: Secondary | ICD-10-CM | POA: Diagnosis not present

## 2021-07-17 DIAGNOSIS — F17210 Nicotine dependence, cigarettes, uncomplicated: Secondary | ICD-10-CM | POA: Diagnosis not present

## 2021-07-17 DIAGNOSIS — I2510 Atherosclerotic heart disease of native coronary artery without angina pectoris: Secondary | ICD-10-CM | POA: Diagnosis not present

## 2021-07-18 DIAGNOSIS — L858 Other specified epidermal thickening: Secondary | ICD-10-CM | POA: Diagnosis not present

## 2021-07-18 DIAGNOSIS — D492 Neoplasm of unspecified behavior of bone, soft tissue, and skin: Secondary | ICD-10-CM | POA: Diagnosis not present

## 2021-07-18 DIAGNOSIS — L720 Epidermal cyst: Secondary | ICD-10-CM | POA: Diagnosis not present

## 2021-08-29 DIAGNOSIS — R590 Localized enlarged lymph nodes: Secondary | ICD-10-CM | POA: Diagnosis not present

## 2021-08-29 DIAGNOSIS — C099 Malignant neoplasm of tonsil, unspecified: Secondary | ICD-10-CM | POA: Diagnosis not present

## 2021-08-29 DIAGNOSIS — I10 Essential (primary) hypertension: Secondary | ICD-10-CM | POA: Diagnosis not present

## 2021-08-29 DIAGNOSIS — J432 Centrilobular emphysema: Secondary | ICD-10-CM | POA: Diagnosis not present

## 2021-08-29 DIAGNOSIS — I2510 Atherosclerotic heart disease of native coronary artery without angina pectoris: Secondary | ICD-10-CM | POA: Diagnosis not present

## 2021-08-29 DIAGNOSIS — Z801 Family history of malignant neoplasm of trachea, bronchus and lung: Secondary | ICD-10-CM | POA: Diagnosis not present

## 2021-08-29 DIAGNOSIS — C109 Malignant neoplasm of oropharynx, unspecified: Secondary | ICD-10-CM | POA: Diagnosis not present

## 2021-08-29 DIAGNOSIS — F17210 Nicotine dependence, cigarettes, uncomplicated: Secondary | ICD-10-CM | POA: Diagnosis not present

## 2021-08-29 DIAGNOSIS — E785 Hyperlipidemia, unspecified: Secondary | ICD-10-CM | POA: Diagnosis not present

## 2021-08-29 DIAGNOSIS — C760 Malignant neoplasm of head, face and neck: Secondary | ICD-10-CM | POA: Diagnosis not present

## 2021-09-25 DIAGNOSIS — J432 Centrilobular emphysema: Secondary | ICD-10-CM | POA: Diagnosis not present

## 2021-09-25 DIAGNOSIS — R059 Cough, unspecified: Secondary | ICD-10-CM | POA: Diagnosis not present

## 2021-09-25 DIAGNOSIS — I10 Essential (primary) hypertension: Secondary | ICD-10-CM | POA: Diagnosis not present

## 2021-09-25 DIAGNOSIS — C099 Malignant neoplasm of tonsil, unspecified: Secondary | ICD-10-CM | POA: Diagnosis not present

## 2021-09-25 DIAGNOSIS — I2510 Atherosclerotic heart disease of native coronary artery without angina pectoris: Secondary | ICD-10-CM | POA: Diagnosis not present

## 2021-09-25 DIAGNOSIS — Z7982 Long term (current) use of aspirin: Secondary | ICD-10-CM | POA: Diagnosis not present

## 2021-09-25 DIAGNOSIS — F17200 Nicotine dependence, unspecified, uncomplicated: Secondary | ICD-10-CM | POA: Diagnosis not present

## 2021-09-25 DIAGNOSIS — E785 Hyperlipidemia, unspecified: Secondary | ICD-10-CM | POA: Diagnosis not present

## 2021-09-25 DIAGNOSIS — K117 Disturbances of salivary secretion: Secondary | ICD-10-CM | POA: Diagnosis not present

## 2021-09-25 DIAGNOSIS — Z79899 Other long term (current) drug therapy: Secondary | ICD-10-CM | POA: Diagnosis not present

## 2021-09-25 DIAGNOSIS — I252 Old myocardial infarction: Secondary | ICD-10-CM | POA: Diagnosis not present

## 2021-09-26 DIAGNOSIS — D492 Neoplasm of unspecified behavior of bone, soft tissue, and skin: Secondary | ICD-10-CM | POA: Diagnosis not present

## 2021-09-26 DIAGNOSIS — L720 Epidermal cyst: Secondary | ICD-10-CM | POA: Diagnosis not present

## 2021-11-01 DIAGNOSIS — F17210 Nicotine dependence, cigarettes, uncomplicated: Secondary | ICD-10-CM | POA: Diagnosis not present

## 2021-11-01 DIAGNOSIS — K117 Disturbances of salivary secretion: Secondary | ICD-10-CM | POA: Diagnosis not present

## 2021-11-01 DIAGNOSIS — N289 Disorder of kidney and ureter, unspecified: Secondary | ICD-10-CM | POA: Diagnosis not present

## 2021-11-01 DIAGNOSIS — C099 Malignant neoplasm of tonsil, unspecified: Secondary | ICD-10-CM | POA: Diagnosis not present

## 2021-11-01 DIAGNOSIS — F17200 Nicotine dependence, unspecified, uncomplicated: Secondary | ICD-10-CM | POA: Diagnosis not present

## 2021-11-01 DIAGNOSIS — I700 Atherosclerosis of aorta: Secondary | ICD-10-CM | POA: Diagnosis not present

## 2021-11-01 DIAGNOSIS — R590 Localized enlarged lymph nodes: Secondary | ICD-10-CM | POA: Diagnosis not present

## 2021-11-01 DIAGNOSIS — J432 Centrilobular emphysema: Secondary | ICD-10-CM | POA: Diagnosis not present

## 2021-11-01 DIAGNOSIS — C109 Malignant neoplasm of oropharynx, unspecified: Secondary | ICD-10-CM | POA: Diagnosis not present

## 2021-11-01 DIAGNOSIS — I2510 Atherosclerotic heart disease of native coronary artery without angina pectoris: Secondary | ICD-10-CM | POA: Diagnosis not present

## 2021-11-01 DIAGNOSIS — R1310 Dysphagia, unspecified: Secondary | ICD-10-CM | POA: Diagnosis not present

## 2021-11-01 DIAGNOSIS — Z923 Personal history of irradiation: Secondary | ICD-10-CM | POA: Diagnosis not present

## 2021-11-09 DIAGNOSIS — C099 Malignant neoplasm of tonsil, unspecified: Secondary | ICD-10-CM | POA: Diagnosis not present

## 2021-11-09 DIAGNOSIS — C109 Malignant neoplasm of oropharynx, unspecified: Secondary | ICD-10-CM | POA: Diagnosis not present

## 2021-11-09 DIAGNOSIS — Z7982 Long term (current) use of aspirin: Secondary | ICD-10-CM | POA: Diagnosis not present

## 2021-11-09 DIAGNOSIS — Z79899 Other long term (current) drug therapy: Secondary | ICD-10-CM | POA: Diagnosis not present

## 2021-11-13 DIAGNOSIS — C109 Malignant neoplasm of oropharynx, unspecified: Secondary | ICD-10-CM | POA: Diagnosis not present

## 2021-11-13 DIAGNOSIS — Z79899 Other long term (current) drug therapy: Secondary | ICD-10-CM | POA: Diagnosis not present

## 2021-11-13 DIAGNOSIS — Z7982 Long term (current) use of aspirin: Secondary | ICD-10-CM | POA: Diagnosis not present

## 2021-11-13 DIAGNOSIS — C099 Malignant neoplasm of tonsil, unspecified: Secondary | ICD-10-CM | POA: Diagnosis not present

## 2021-11-22 DIAGNOSIS — Z833 Family history of diabetes mellitus: Secondary | ICD-10-CM | POA: Diagnosis not present

## 2021-11-22 DIAGNOSIS — Z8249 Family history of ischemic heart disease and other diseases of the circulatory system: Secondary | ICD-10-CM | POA: Diagnosis not present

## 2021-11-22 DIAGNOSIS — E7800 Pure hypercholesterolemia, unspecified: Secondary | ICD-10-CM | POA: Diagnosis not present

## 2021-11-22 DIAGNOSIS — Z801 Family history of malignant neoplasm of trachea, bronchus and lung: Secondary | ICD-10-CM | POA: Diagnosis not present

## 2021-11-22 DIAGNOSIS — I252 Old myocardial infarction: Secondary | ICD-10-CM | POA: Diagnosis not present

## 2021-11-22 DIAGNOSIS — Z7982 Long term (current) use of aspirin: Secondary | ICD-10-CM | POA: Diagnosis not present

## 2021-11-22 DIAGNOSIS — I10 Essential (primary) hypertension: Secondary | ICD-10-CM | POA: Diagnosis not present

## 2021-11-22 DIAGNOSIS — I2510 Atherosclerotic heart disease of native coronary artery without angina pectoris: Secondary | ICD-10-CM | POA: Diagnosis not present

## 2021-11-22 DIAGNOSIS — Z923 Personal history of irradiation: Secondary | ICD-10-CM | POA: Diagnosis not present

## 2021-11-22 DIAGNOSIS — Z720 Tobacco use: Secondary | ICD-10-CM | POA: Diagnosis not present

## 2021-11-22 DIAGNOSIS — Z23 Encounter for immunization: Secondary | ICD-10-CM | POA: Diagnosis not present

## 2021-11-22 DIAGNOSIS — F17210 Nicotine dependence, cigarettes, uncomplicated: Secondary | ICD-10-CM | POA: Diagnosis not present

## 2021-12-21 DIAGNOSIS — H2513 Age-related nuclear cataract, bilateral: Secondary | ICD-10-CM | POA: Diagnosis not present

## 2022-01-05 DIAGNOSIS — C760 Malignant neoplasm of head, face and neck: Secondary | ICD-10-CM | POA: Diagnosis not present

## 2022-01-05 DIAGNOSIS — Z510 Encounter for antineoplastic radiation therapy: Secondary | ICD-10-CM | POA: Diagnosis not present

## 2022-01-09 DIAGNOSIS — C760 Malignant neoplasm of head, face and neck: Secondary | ICD-10-CM | POA: Diagnosis not present

## 2022-01-09 DIAGNOSIS — Z510 Encounter for antineoplastic radiation therapy: Secondary | ICD-10-CM | POA: Diagnosis not present

## 2022-01-11 DIAGNOSIS — C760 Malignant neoplasm of head, face and neck: Secondary | ICD-10-CM | POA: Diagnosis not present

## 2022-01-11 DIAGNOSIS — Z510 Encounter for antineoplastic radiation therapy: Secondary | ICD-10-CM | POA: Diagnosis not present

## 2023-03-04 DIAGNOSIS — Z79899 Other long term (current) drug therapy: Secondary | ICD-10-CM | POA: Diagnosis not present

## 2023-03-04 DIAGNOSIS — J432 Centrilobular emphysema: Secondary | ICD-10-CM | POA: Diagnosis not present

## 2023-03-04 DIAGNOSIS — F17210 Nicotine dependence, cigarettes, uncomplicated: Secondary | ICD-10-CM | POA: Diagnosis not present

## 2023-03-04 DIAGNOSIS — Z5181 Encounter for therapeutic drug level monitoring: Secondary | ICD-10-CM | POA: Diagnosis not present

## 2023-03-04 DIAGNOSIS — Z8619 Personal history of other infectious and parasitic diseases: Secondary | ICD-10-CM | POA: Diagnosis not present

## 2023-03-04 DIAGNOSIS — Z1159 Encounter for screening for other viral diseases: Secondary | ICD-10-CM | POA: Diagnosis not present

## 2023-03-04 DIAGNOSIS — M85852 Other specified disorders of bone density and structure, left thigh: Secondary | ICD-10-CM | POA: Diagnosis not present

## 2023-03-04 DIAGNOSIS — Z1321 Encounter for screening for nutritional disorder: Secondary | ICD-10-CM | POA: Diagnosis not present

## 2023-03-04 DIAGNOSIS — Z9189 Other specified personal risk factors, not elsewhere classified: Secondary | ICD-10-CM | POA: Diagnosis not present

## 2023-03-06 DIAGNOSIS — C109 Malignant neoplasm of oropharynx, unspecified: Secondary | ICD-10-CM | POA: Diagnosis not present

## 2023-03-06 DIAGNOSIS — E038 Other specified hypothyroidism: Secondary | ICD-10-CM | POA: Diagnosis not present

## 2023-03-06 DIAGNOSIS — R590 Localized enlarged lymph nodes: Secondary | ICD-10-CM | POA: Diagnosis not present

## 2023-04-24 DIAGNOSIS — J432 Centrilobular emphysema: Secondary | ICD-10-CM | POA: Diagnosis not present

## 2023-04-24 DIAGNOSIS — Z720 Tobacco use: Secondary | ICD-10-CM | POA: Diagnosis not present

## 2023-04-24 DIAGNOSIS — M8580 Other specified disorders of bone density and structure, unspecified site: Secondary | ICD-10-CM | POA: Diagnosis not present

## 2023-04-29 DIAGNOSIS — H2513 Age-related nuclear cataract, bilateral: Secondary | ICD-10-CM | POA: Diagnosis not present

## 2023-05-14 DIAGNOSIS — Z510 Encounter for antineoplastic radiation therapy: Secondary | ICD-10-CM | POA: Diagnosis not present

## 2023-05-14 DIAGNOSIS — C760 Malignant neoplasm of head, face and neck: Secondary | ICD-10-CM | POA: Diagnosis not present

## 2023-05-14 DIAGNOSIS — K117 Disturbances of salivary secretion: Secondary | ICD-10-CM | POA: Diagnosis not present

## 2023-05-14 DIAGNOSIS — C099 Malignant neoplasm of tonsil, unspecified: Secondary | ICD-10-CM | POA: Diagnosis not present

## 2023-05-14 DIAGNOSIS — K5900 Constipation, unspecified: Secondary | ICD-10-CM | POA: Diagnosis not present

## 2023-05-14 DIAGNOSIS — F17200 Nicotine dependence, unspecified, uncomplicated: Secondary | ICD-10-CM | POA: Diagnosis not present

## 2023-05-14 DIAGNOSIS — C109 Malignant neoplasm of oropharynx, unspecified: Secondary | ICD-10-CM | POA: Diagnosis not present

## 2023-05-14 DIAGNOSIS — Z955 Presence of coronary angioplasty implant and graft: Secondary | ICD-10-CM | POA: Diagnosis not present

## 2023-05-14 DIAGNOSIS — R432 Parageusia: Secondary | ICD-10-CM | POA: Diagnosis not present

## 2023-08-19 DIAGNOSIS — K117 Disturbances of salivary secretion: Secondary | ICD-10-CM | POA: Diagnosis not present

## 2023-08-19 DIAGNOSIS — F17200 Nicotine dependence, unspecified, uncomplicated: Secondary | ICD-10-CM | POA: Diagnosis not present

## 2023-08-19 DIAGNOSIS — R432 Parageusia: Secondary | ICD-10-CM | POA: Diagnosis not present

## 2023-08-19 DIAGNOSIS — C099 Malignant neoplasm of tonsil, unspecified: Secondary | ICD-10-CM | POA: Diagnosis not present

## 2023-08-19 DIAGNOSIS — Z955 Presence of coronary angioplasty implant and graft: Secondary | ICD-10-CM | POA: Diagnosis not present

## 2023-08-19 DIAGNOSIS — C760 Malignant neoplasm of head, face and neck: Secondary | ICD-10-CM | POA: Diagnosis not present

## 2023-08-19 DIAGNOSIS — Z510 Encounter for antineoplastic radiation therapy: Secondary | ICD-10-CM | POA: Diagnosis not present

## 2023-08-19 DIAGNOSIS — C109 Malignant neoplasm of oropharynx, unspecified: Secondary | ICD-10-CM | POA: Diagnosis not present

## 2023-08-19 DIAGNOSIS — Z87891 Personal history of nicotine dependence: Secondary | ICD-10-CM | POA: Diagnosis not present

## 2023-08-19 DIAGNOSIS — K5900 Constipation, unspecified: Secondary | ICD-10-CM | POA: Diagnosis not present

## 2023-09-16 DIAGNOSIS — Z8619 Personal history of other infectious and parasitic diseases: Secondary | ICD-10-CM | POA: Diagnosis not present

## 2023-09-16 DIAGNOSIS — E038 Other specified hypothyroidism: Secondary | ICD-10-CM | POA: Diagnosis not present

## 2023-09-16 DIAGNOSIS — Z9189 Other specified personal risk factors, not elsewhere classified: Secondary | ICD-10-CM | POA: Diagnosis not present

## 2023-09-16 DIAGNOSIS — C109 Malignant neoplasm of oropharynx, unspecified: Secondary | ICD-10-CM | POA: Diagnosis not present

## 2023-09-16 DIAGNOSIS — Z79899 Other long term (current) drug therapy: Secondary | ICD-10-CM | POA: Diagnosis not present

## 2023-09-16 DIAGNOSIS — Z5181 Encounter for therapeutic drug level monitoring: Secondary | ICD-10-CM | POA: Diagnosis not present

## 2023-11-11 DIAGNOSIS — Z122 Encounter for screening for malignant neoplasm of respiratory organs: Secondary | ICD-10-CM | POA: Diagnosis not present

## 2023-11-11 DIAGNOSIS — Z87891 Personal history of nicotine dependence: Secondary | ICD-10-CM | POA: Diagnosis not present

## 2023-11-11 DIAGNOSIS — F17210 Nicotine dependence, cigarettes, uncomplicated: Secondary | ICD-10-CM | POA: Diagnosis not present

## 2023-11-20 DIAGNOSIS — Z9221 Personal history of antineoplastic chemotherapy: Secondary | ICD-10-CM | POA: Diagnosis not present

## 2023-11-20 DIAGNOSIS — C099 Malignant neoplasm of tonsil, unspecified: Secondary | ICD-10-CM | POA: Diagnosis not present

## 2023-11-20 DIAGNOSIS — C109 Malignant neoplasm of oropharynx, unspecified: Secondary | ICD-10-CM | POA: Diagnosis not present

## 2023-11-20 DIAGNOSIS — F17200 Nicotine dependence, unspecified, uncomplicated: Secondary | ICD-10-CM | POA: Diagnosis not present

## 2023-11-20 DIAGNOSIS — C01 Malignant neoplasm of base of tongue: Secondary | ICD-10-CM | POA: Diagnosis not present

## 2023-11-20 DIAGNOSIS — Z923 Personal history of irradiation: Secondary | ICD-10-CM | POA: Diagnosis not present
